# Patient Record
Sex: Female | Born: 2000 | Race: Black or African American | Hispanic: No | Marital: Single | Smoking: Never smoker
Health system: Southern US, Community
[De-identification: ages and names within clinical notes are randomized; demographics above are authoritative.]

## PROBLEM LIST (undated history)

## (undated) DIAGNOSIS — M1990 Unspecified osteoarthritis, unspecified site: Secondary | ICD-10-CM

## (undated) DIAGNOSIS — I10 Essential (primary) hypertension: Secondary | ICD-10-CM

---
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
## 2020-02-03 ENCOUNTER — Emergency Department
Admission: EM | Admit: 2020-02-03 | Discharge: 2020-02-03 | Disposition: A | Discharge status: Home / Self Care | Payer: Medicaid Other | Attending: Emergency Medicine | Admitting: Emergency Medicine

## 2020-02-03 ENCOUNTER — Other Ambulatory Visit: Payer: Self-pay

## 2020-02-03 DIAGNOSIS — J0100 Acute maxillary sinusitis, unspecified: Secondary | ICD-10-CM | POA: Insufficient documentation

## 2020-02-03 DIAGNOSIS — I10 Essential (primary) hypertension: Secondary | ICD-10-CM | POA: Diagnosis not present

## 2020-02-03 DIAGNOSIS — R0981 Nasal congestion: Secondary | ICD-10-CM | POA: Diagnosis present

## 2020-02-03 DIAGNOSIS — Z79899 Other long term (current) drug therapy: Secondary | ICD-10-CM | POA: Diagnosis not present

## 2020-02-03 MED ORDER — FEXOFENADINE-PSEUDOEPHED ER 60-120 MG PO TB12: 1.0000 | ORAL_TABLET | Freq: Two times a day (BID) | ORAL | 0 refills | Status: DC

## 2020-02-03 MED ORDER — AMOXICILLIN-POT CLAVULANATE 875-125 MG PO TABS: 1.0000 | ORAL_TABLET | Freq: Two times a day (BID) | ORAL | 0 refills | Status: DC

## 2020-02-03 NOTE — ED Provider Notes (Signed)
[facility] Emergency Department Provider Note   ____________________________________________   First MD Initiated Contact with Patient 02/03/20 1348     (approximate)  I have reviewed the triage vital signs and the nursing notes.   HISTORY  Chief Complaint Nasal Congestion    HPI Lisa [place] is a 19 y.o. female patient presents with sinus congestion, facial pain, frontal headache for 4 to 5 days.  Patient states there is a greenish nasal discharge.  Patient unsure of fever.  Patient has history of allergic rhinitis and takes maintenance medicine for that complaint.  Patient rates her pain as a 7/10.  Patient described the pain as "achy/pressure".         Past Medical History:  Diagnosis Date  . Arthritis   . Hypertension     There are no problems to display for this patient.   History reviewed. No pertinent surgical history.  Prior to Admission medications   Medication Sig Start Date End Date Taking? Authorizing Provider  albuterol (PROVENTIL HFA;VENTOLIN HFA) 108 (90 Base) MCG/ACT inhaler Inhale 2 puffs into the lungs every 6 (six) hours as needed for wheezing or shortness of breath. 01/10/19   Enid Derry, PA-C  amoxicillin-clavulanate (AUGMENTIN) 875-125 MG tablet Take 1 tablet by mouth 2 (two) times daily. 02/03/20   Joni Reining, PA-C  cloNIDine (CATAPRES) 0.1 MG tablet Take 1 tablet (0.1 mg total) by mouth daily. 01/10/19   Enid Derry, PA-C  fexofenadine-pseudoephedrine (ALLEGRA-D) 60-120 MG 12 hr tablet Take 1 tablet by mouth 2 (two) times daily. 02/03/20   Joni Reining, PA-C  fluticasone (FLONASE) 50 MCG/ACT nasal spray Place 1 spray into both nostrils daily. 01/10/19   Enid Derry, PA-C  hydrOXYzine (ATARAX/VISTARIL) 25 MG tablet Take 1 tablet (25 mg total) by mouth 3 (three) times daily as needed. 01/10/19   Enid Derry, PA-C    Allergies Patient has no known allergies.  No family history on file.  Social History  Social History   Tobacco Use  . Smoking status: Never Smoker  . Smokeless tobacco: Never Used  Substance Use Topics  . Alcohol use: Never  . Drug use: Never    Review of Systems Constitutional: No fever/chills Eyes: No visual changes. ENT: No sore throat.  Nasal congestion. Cardiovascular: Denies chest pain. Respiratory: Denies shortness of breath. Gastrointestinal: No abdominal pain.  No nausea, no vomiting.  No diarrhea.  No constipation. Genitourinary: Negative for dysuria. Musculoskeletal: Negative for back pain. Skin: Negative for rash. Neurological: Negative for headaches, focal weakness or numbness. Endocrine:  Hypertension ____________________________________________   PHYSICAL EXAM:  VITAL SIGNS: ED Triage Vitals  Enc Vitals Group     BP 02/03/20 1337 126/77     Pulse Rate 02/03/20 1337 84     Resp 02/03/20 1337 16     Temp 02/03/20 1337 97.8 F (36.6 C)     Temp Source 02/03/20 1337 Oral     SpO2 02/03/20 1337 99 %     Weight 02/03/20 1335 220 lb (99.8 kg)     Height 02/03/20 1335 5\' 2"  (1.575 m)     Head Circumference --      Peak Flow --      Pain Score 02/03/20 1335 7     Pain Loc --      Pain Edu? --      Excl. in GC? --    Constitutional: Alert and oriented. Well appearing and in no acute distress. Nose: Edematous nasal turbinates.  Bilateral maxillary  guarding. Mouth/Throat: Mucous membranes are moist.  Oropharynx non-erythematous. Cardiovascular: Normal rate, regular rhythm. Grossly normal heart sounds.  Good peripheral circulation. Respiratory: Normal respiratory effort.  No retractions. Lungs CTAB. Skin:  Skin is warm, dry and intact. No rash noted. Psychiatric: Mood and affect are normal. Speech and behavior are normal.  ____________________________________________   LABS (all labs ordered are listed, but only abnormal results are displayed)  Labs Reviewed - No data to display ____________________________________________  EKG    ____________________________________________  RADIOLOGY  ED MD interpretation:    Official radiology report(s): No results found.  ____________________________________________   PROCEDURES  Procedure(s) performed (including Critical Care):  Procedures   ____________________________________________   INITIAL IMPRESSION / ASSESSMENT AND PLAN / ED COURSE  As part of my medical decision making, I reviewed the following data within the [place]     Patient presents with nasal congestion and facial pain.  Differential considerations sinusitis or allergic rhinitis.  Physical exam is consistent with subacute maxillary sinusitis.  Patient given discharge care instructions and advised take medication as directed.  Patient advised establish care with [facility].    Lisa [place] was evaluated in Emergency Department on 02/03/2020 for the symptoms described in the history of present illness. She was evaluated in the context of the global COVID-19 pandemic, which necessitated consideration that the patient might be at risk for infection with the SARS-CoV-2 virus that causes COVID-19. Institutional protocols and algorithms that pertain to the evaluation of patients at risk for COVID-19 are in a state of rapid change based on information released by regulatory bodies including the CDC and federal and state organizations. These policies and algorithms were followed during the patient's care in the ED.       ____________________________________________   FINAL CLINICAL IMPRESSION(S) / ED DIAGNOSES  Final diagnoses:  Subacute maxillary sinusitis     ED Discharge Orders         Ordered    fexofenadine-pseudoephedrine (ALLEGRA-D) 60-120 MG 12 hr tablet  2 times daily     02/03/20 1358    amoxicillin-clavulanate (AUGMENTIN) 875-125 MG tablet  2 times daily     02/03/20 1400           Note:  This document was prepared using Dragon voice recognition  software and may include unintentional dictation errors.    Sable Feil, PA-C 02/03/20 1406    Earleen Newport, MD 02/03/20 1435

## 2020-02-03 NOTE — ED Triage Notes (Signed)
Pt c/o sinus congestion and HA for the past 4 days.

## 2020-02-03 NOTE — Discharge Instructions (Addendum)
Follow discharge care instructions take medication as directed. °

## 2020-02-15 ENCOUNTER — Other Ambulatory Visit: Payer: Self-pay

## 2020-02-15 ENCOUNTER — Emergency Department
Admission: EM | Admit: 2020-02-15 | Discharge: 2020-02-15 | Disposition: A | Discharge status: Home / Self Care | Payer: Medicaid Other | Attending: Student | Admitting: Student

## 2020-02-15 DIAGNOSIS — J301 Allergic rhinitis due to pollen: Secondary | ICD-10-CM

## 2020-02-15 DIAGNOSIS — I10 Essential (primary) hypertension: Secondary | ICD-10-CM | POA: Insufficient documentation

## 2020-02-15 DIAGNOSIS — J019 Acute sinusitis, unspecified: Secondary | ICD-10-CM | POA: Diagnosis present

## 2020-02-15 DIAGNOSIS — Z79899 Other long term (current) drug therapy: Secondary | ICD-10-CM | POA: Diagnosis not present

## 2020-02-15 MED ORDER — CETIRIZINE HCL 10 MG PO TABS: 10.0000 mg | ORAL_TABLET | Freq: Every day | ORAL | 0 refills | Status: AC

## 2020-02-15 MED ORDER — AMOXICILLIN-POT CLAVULANATE 875-125 MG PO TABS: 1.0000 | ORAL_TABLET | Freq: Two times a day (BID) | ORAL | 0 refills | Status: DC

## 2020-02-15 MED ORDER — FLUTICASONE PROPIONATE 50 MCG/ACT NA SUSP: 1.0000 | Freq: Two times a day (BID) | NASAL | 0 refills | Status: DC

## 2020-02-15 NOTE — ED Triage Notes (Signed)
Pt states she was seen in ED 2 weeks ago for same Pt c/o sinus pressure and tight feeling in her nose with pressure behind her eyes

## 2020-02-15 NOTE — ED Provider Notes (Signed)
[facility] Emergency Department Provider Note  ____________________________________________  Time seen: Approximately 6:13 PM  I have reviewed the triage vital signs and the nursing notes.   HISTORY  Chief Complaint Facial Pain    HPI Lisa Sampson is a 19 y.o. female who presents the emergency department for evaluation of sinus pressure.  Patient states that she has pressure in the maxillary and ethmoid sinus region.  No fevers or chills.  She does endorse some nasal congestion but no fevers or chills, sore throat, cough, shortness of breath.  Patient had similar symptoms 2 weeks ago, was treated with antibiotics for same.  She states that this seemed to improve her symptoms somewhat but never fully alleviated her symptoms and they returned in full force.  Patient does have a history of allergic rhinitis but does not take any medications on a regular basis for same.  No medicines for this complaint prior to arrival.         Past Medical History:  Diagnosis Date  . Arthritis   . Hypertension     There are no problems to display for this patient.   History reviewed. No pertinent surgical history.  Prior to Admission medications   Medication Sig Start Date End Date Taking? Authorizing Provider  albuterol (PROVENTIL HFA;VENTOLIN HFA) 108 (90 Base) MCG/ACT inhaler Inhale 2 puffs into the lungs every 6 (six) hours as needed for wheezing or shortness of breath. 01/10/19   Laban Emperor, PA-C  amoxicillin-clavulanate (AUGMENTIN) 875-125 MG tablet Take 1 tablet by mouth 2 (two) times daily. 02/15/20   Suzette Flagler, Charline Bills, PA-C  cetirizine (ZYRTEC) 10 MG tablet Take 1 tablet (10 mg total) by mouth daily. 02/15/20   Novalynn Branaman, Charline Bills, PA-C  cloNIDine (CATAPRES) 0.1 MG tablet Take 1 tablet (0.1 mg total) by mouth daily. 01/10/19   Laban Emperor, PA-C  fluticasone (FLONASE) 50 MCG/ACT nasal spray Place 1 spray into both nostrils 2 (two) times daily. 02/15/20    Jasiyah Paulding, Charline Bills, PA-C  hydrOXYzine (ATARAX/VISTARIL) 25 MG tablet Take 1 tablet (25 mg total) by mouth 3 (three) times daily as needed. 01/10/19   Laban Emperor, PA-C    Allergies Patient has no known allergies.  No family history on file.  Social History Social History   Tobacco Use  . Smoking status: Never Smoker  . Smokeless tobacco: Never Used  Substance Use Topics  . Alcohol use: Never  . Drug use: Never     Review of Systems  Constitutional: No fever/chills Eyes: No visual changes. No discharge ENT: Positive for congestion and sinus pressure Cardiovascular: no chest pain. Respiratory: no cough. No SOB. Gastrointestinal: No abdominal pain.  No nausea, no vomiting.  No diarrhea.  No constipation. Musculoskeletal: Negative for musculoskeletal pain. Skin: Negative for rash, abrasions, lacerations, ecchymosis. Neurological: Negative for headaches, focal weakness or numbness. 10-point ROS otherwise negative.  ____________________________________________   PHYSICAL EXAM:  VITAL SIGNS: ED Triage Vitals  Enc Vitals Group     BP 02/15/20 1611 132/90     Pulse Rate 02/15/20 1611 92     Resp 02/15/20 1611 16     Temp 02/15/20 1611 98.6 F (37 C)     Temp Source 02/15/20 1611 Oral     SpO2 02/15/20 1611 98 %     Weight 02/15/20 1612 220 lb (99.8 kg)     Height 02/15/20 1612 5\' 2"  (1.575 m)     Head Circumference --      Peak Flow --  Pain Score 02/15/20 1612 7     Pain Loc --      Pain Edu? --      Excl. in GC? --      Constitutional: Alert and oriented. Well appearing and in no acute distress. Eyes: Conjunctivae are normal. PERRL. EOMI. Head: Atraumatic. ENT:      Ears:       Nose: Mild congestion/rhinnorhea.  Turbinates are slightly boggy.  No tenderness to percussion over the sinuses.      Mouth/Throat: Mucous membranes are moist.  Neck: No stridor.   Hematological/Lymphatic/Immunilogical: No cervical lymphadenopathy. Cardiovascular: Normal  rate, regular rhythm. Normal S1 and S2.  Good peripheral circulation. Respiratory: Normal respiratory effort without tachypnea or retractions. Lungs CTAB. Good air entry to the bases with no decreased or absent breath sounds. Musculoskeletal: Full range of motion to all extremities. No gross deformities appreciated. Neurologic:  Normal speech and language. No gross focal neurologic deficits are appreciated.  Skin:  Skin is warm, dry and intact. No rash noted. Psychiatric: Mood and affect are normal. Speech and behavior are normal. Patient exhibits appropriate insight and judgement.   ____________________________________________   LABS (all labs ordered are listed, but only abnormal results are displayed)  Labs Reviewed - No data to display ____________________________________________  EKG   ____________________________________________  RADIOLOGY   No results found.  ____________________________________________    PROCEDURES  Procedure(s) performed:    Procedures    Medications - No data to display   ____________________________________________   INITIAL IMPRESSION / ASSESSMENT AND PLAN / ED COURSE  Pertinent labs & imaging results that were available during my care of the patient were reviewed by me and considered in my medical decision making (see chart for details).  Review of the [place] CSRS was performed in accordance of the [facility] prior to dispensing any controlled drugs.           Patient's diagnosis is consistent with allergic rhinitis.  Patient presents emergency department with ongoing nasal congestion, sinus pressure.  Patient had been evaluated 2 weeks ago, diagnosed with bacterial sinusitis.  Patient took her antibiotics, and states that this did not fully alleviate her symptoms.  On exam I find that symptoms are most consistent with allergic rhinitis.  I have a very low suspicion for bacterial sinusitis but as patient was on a course to begin with, I  will repeat the course of antibiotics for the patient.  Patient will also be placed on Flonase, Zyrtec..  Follow-up primary care as needed patient is given ED precautions to return to the ED for any worsening or new symptoms.     ____________________________________________  FINAL CLINICAL IMPRESSION(S) / ED DIAGNOSES  Final diagnoses:  Seasonal allergic rhinitis due to pollen      NEW MEDICATIONS STARTED DURING THIS VISIT:  ED Discharge Orders         Ordered    fluticasone (FLONASE) 50 MCG/ACT nasal spray  2 times daily     02/15/20 1814    cetirizine (ZYRTEC) 10 MG tablet  Daily     02/15/20 1814    amoxicillin-clavulanate (AUGMENTIN) 875-125 MG tablet  2 times daily     02/15/20 1814              This chart was dictated using voice recognition software/Dragon. Despite best efforts to proofread, errors can occur which can change the meaning. Any change was purely unintentional.    Racheal Patches, PA-C 02/15/20 1825    Miguel Aschoff., MD  02/16/20 1500  

## 2020-02-24 ENCOUNTER — Other Ambulatory Visit: Payer: Self-pay | Admitting: Primary Care

## 2020-02-24 DIAGNOSIS — N911 Secondary amenorrhea: Secondary | ICD-10-CM

## 2020-03-02 ENCOUNTER — Ambulatory Visit: Payer: Medicaid Other | Attending: Primary Care

## 2020-03-15 ENCOUNTER — Ambulatory Visit
Admission: RE | Admit: 2020-03-15 | Discharge: 2020-03-15 | Disposition: A | Discharge status: Home / Self Care | Payer: Medicaid Other | Source: Ambulatory Visit | Attending: Primary Care | Admitting: Primary Care

## 2020-03-15 ENCOUNTER — Other Ambulatory Visit: Payer: Self-pay | Admitting: Primary Care

## 2020-03-15 ENCOUNTER — Other Ambulatory Visit: Payer: Self-pay

## 2020-03-15 DIAGNOSIS — N911 Secondary amenorrhea: Secondary | ICD-10-CM

## 2020-03-19 ENCOUNTER — Other Ambulatory Visit: Payer: Self-pay | Admitting: Primary Care

## 2020-03-19 DIAGNOSIS — N644 Mastodynia: Secondary | ICD-10-CM

## 2020-03-25 ENCOUNTER — Other Ambulatory Visit: Payer: Medicaid Other

## 2020-03-30 ENCOUNTER — Telehealth: Payer: Self-pay | Admitting: Obstetrics & Gynecology

## 2020-03-30 NOTE — Telephone Encounter (Signed)
Lisa Sampson referring for secondary amenorrhea with irregular periods u/s with normal uterus, but ovaries not visualized, so PCOS cannot be ruled out. Requesting further evaluation and management. Paper records. Voicemail not set up unable to leave message for patient to call back to be scheduled.

## 2020-04-01 ENCOUNTER — Inpatient Hospital Stay: Admission: RE | Admit: 2020-04-01 | Payer: Medicaid Other | Source: Ambulatory Visit

## 2020-04-12 ENCOUNTER — Encounter: Payer: Medicaid Other | Admitting: Obstetrics and Gynecology

## 2020-04-21 NOTE — Telephone Encounter (Signed)
Voicemail box is full unable to leave message. Multiple attempts to reach patient have been unsuccessful. Noted in the referral notes.

## 2021-04-26 NOTE — Telephone Encounter (Signed)
Voicemail box is full - unable to leave message

## 2021-05-23 IMAGING — US US PELVIS COMPLETE
1 series · 14 of 25 positions shown · non-contrast
Comparison: None

CLINICAL DATA: Secondary amenorrhea, no menstrual period for 4
months, question PCOS

EXAM:
TRANSABDOMINAL ULTRASOUND OF PELVIS
TECHNIQUE: Transabdominal ultrasound examination of the pelvis was performed
including evaluation of the uterus, ovaries, adnexal regions, and
pelvic cul-de-sac. Transvaginal imaging was not performed as patient
denied having been sexually active.

[Series 1: us pelvic complete with transvaginal · 14 of 39 slices shown]
[im 1/39]
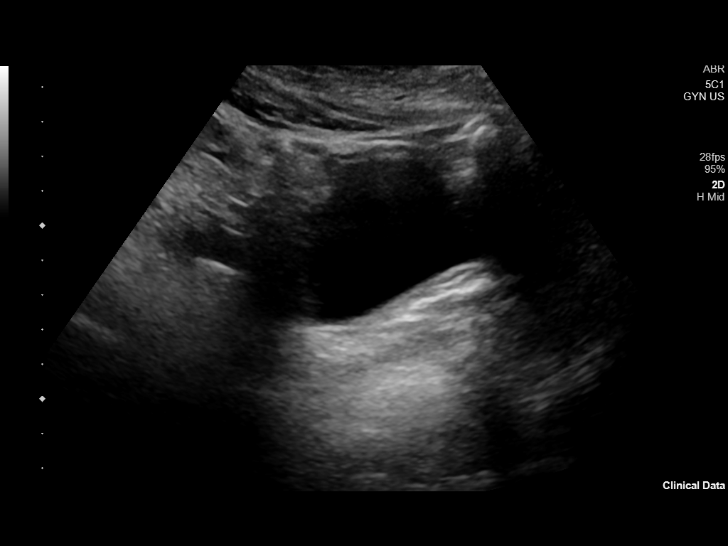
[im 4/39]
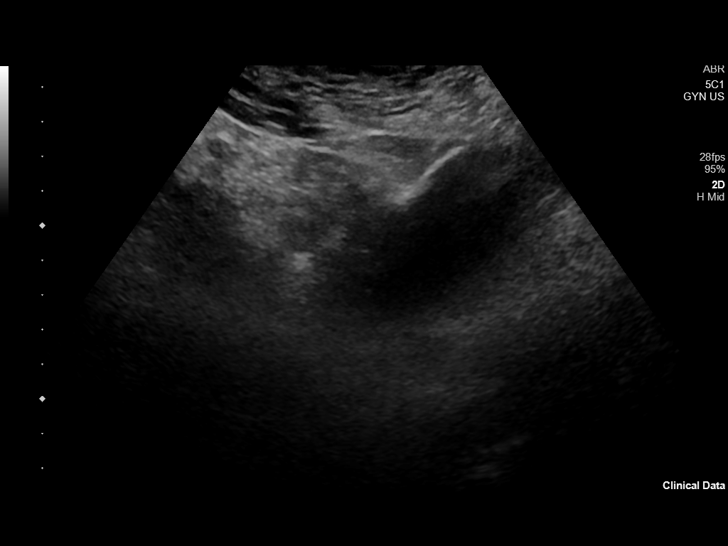
[im 7/39]
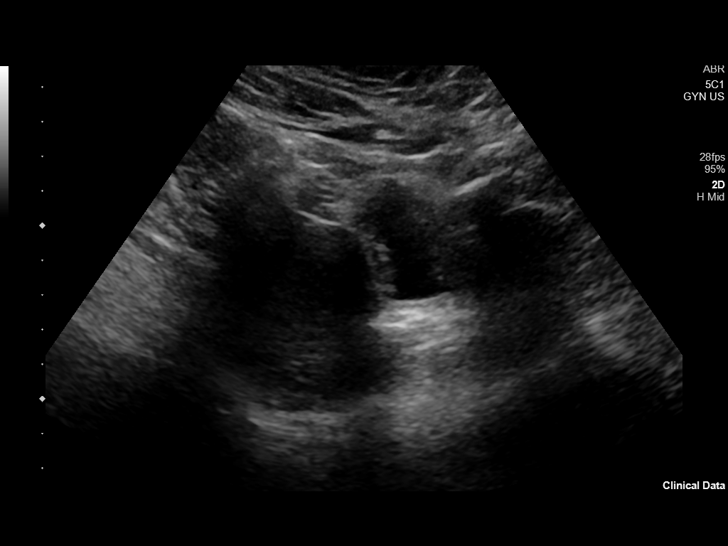
[im 10/39]
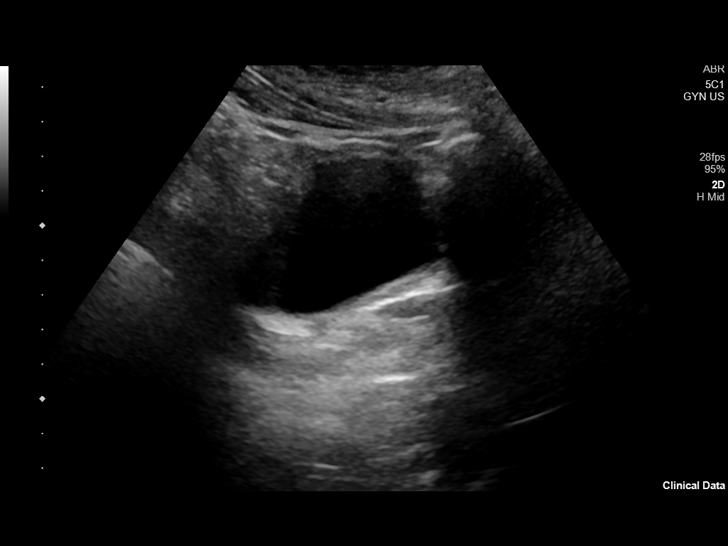
[im 13/39]
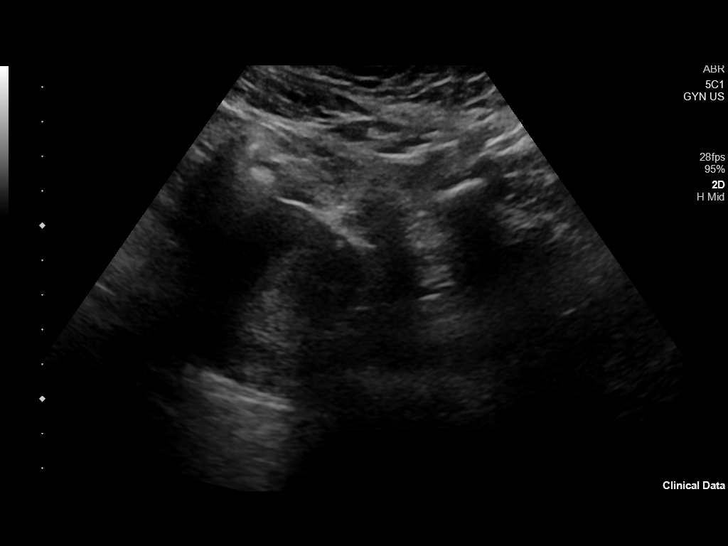
[im 15/39]
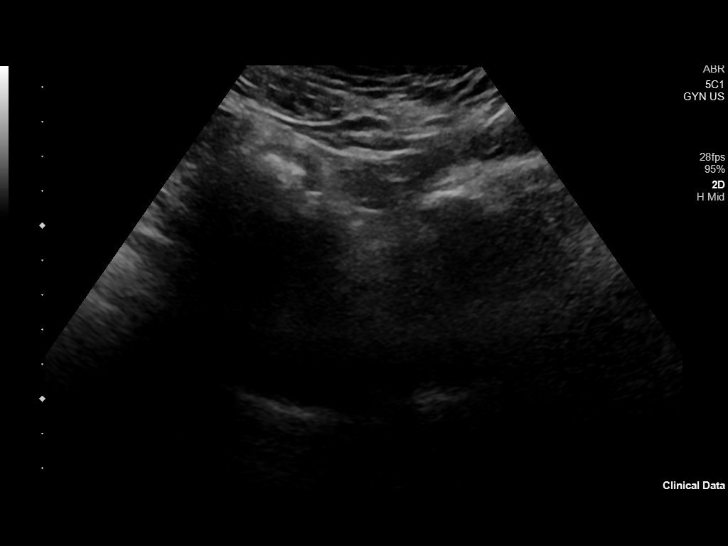
[im 18/39]
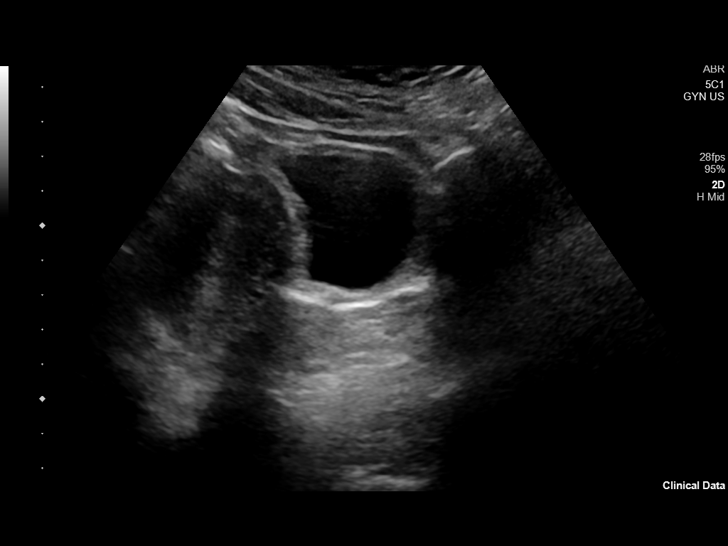
[im 21/39]
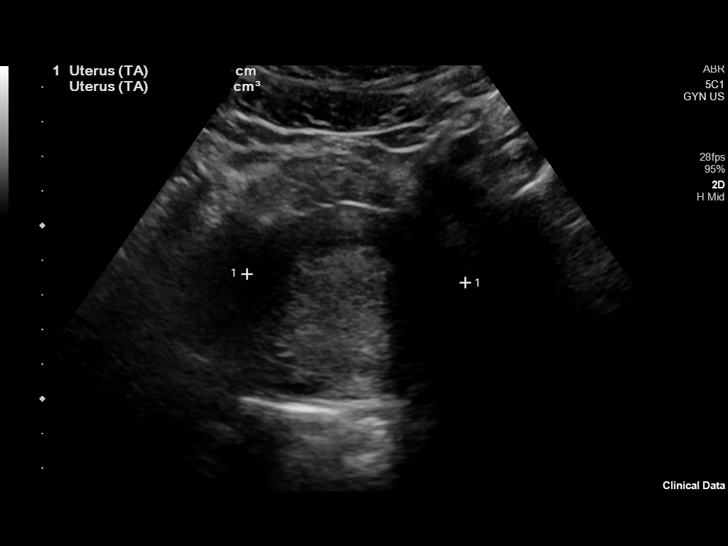
[im 24/39]
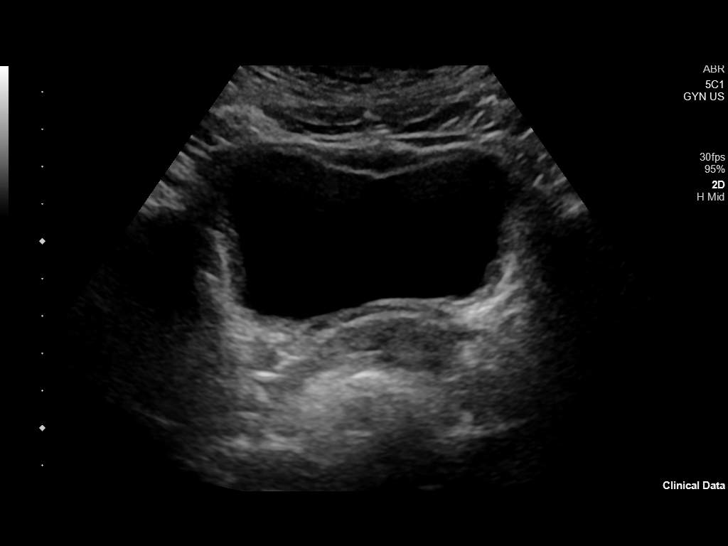
[im 26/39]
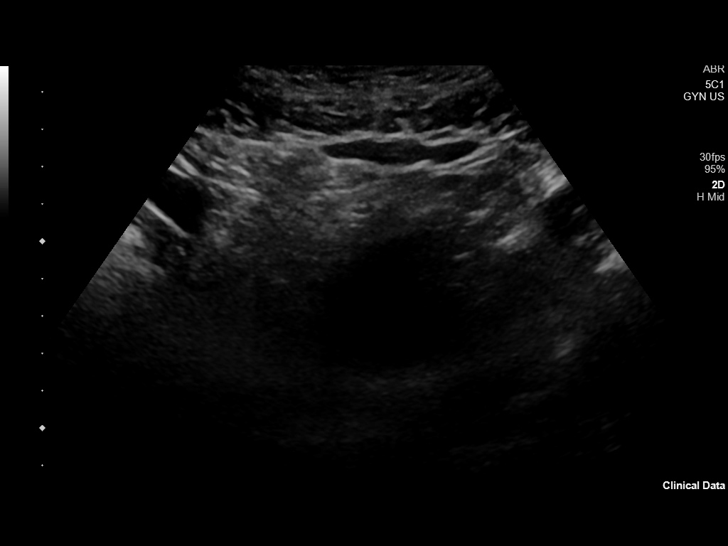
[im 29/39]
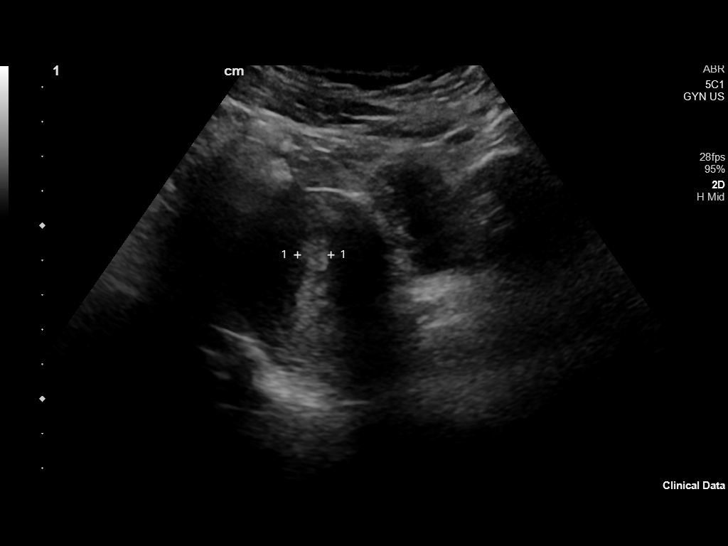
[im 32/39]
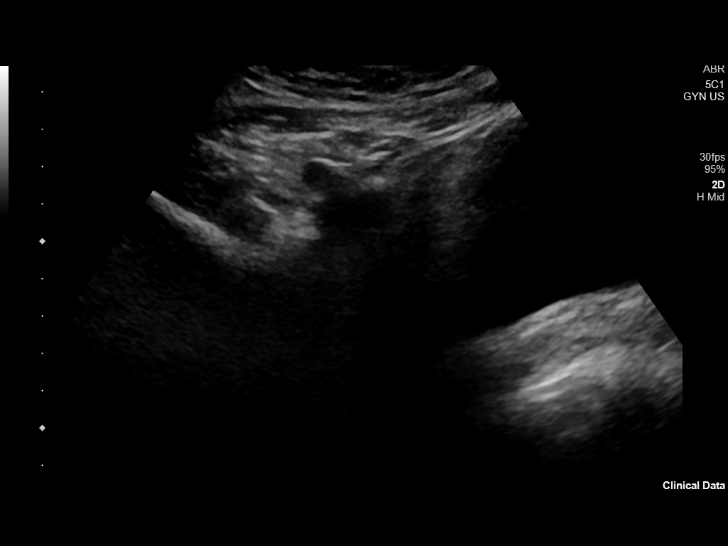
[im 35/39]
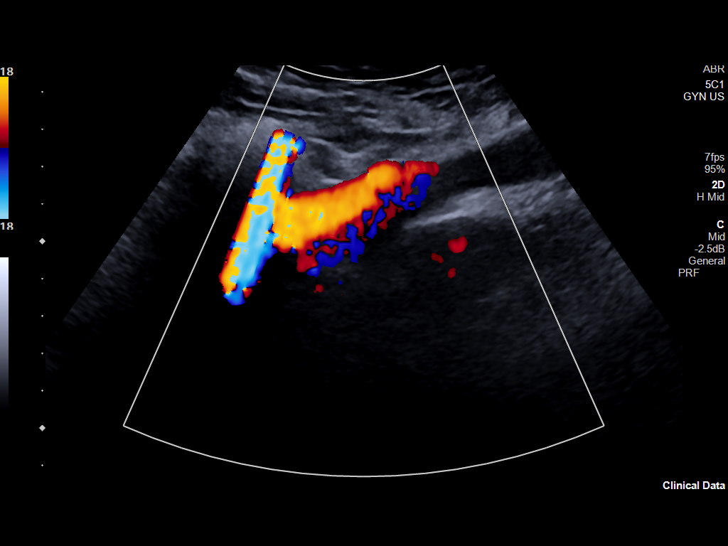
[im 39/39]
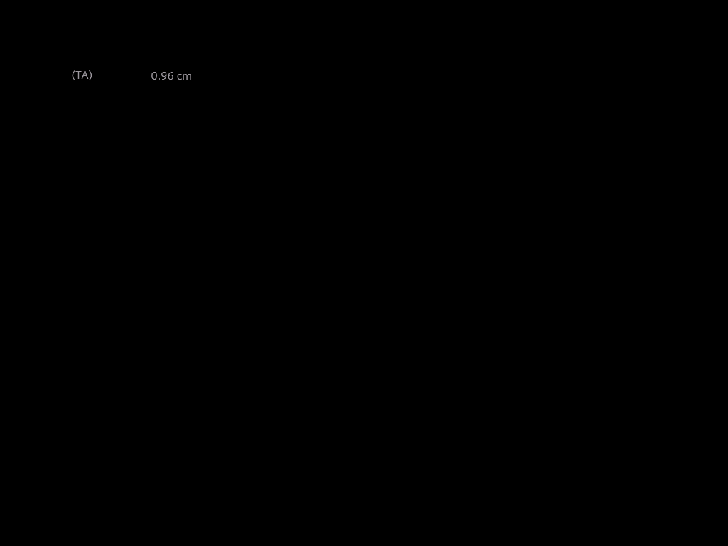

[14 of 25 positions shown; findings below may reference images not displayed]

FINDINGS: Uterus

Measurements: At 6.5 x 4.7 x 6.3 cm = volume: 102 mL. Anteverted.
Suboptimally visualized due to inadequate bladder distention
(despite 2 bottles of water), bowel gas, and body habitus. No focal
uterine mass.

Endometrium

Thickness: 10 mm.  No endometrial fluid or focal abnormality

Right ovary

Not visualized, likely obscured by bowel

Left ovary

Not visualized, likely obscured by bowel

Other findings:  No free pelvic fluid.  No adnexal masses.
IMPRESSION: Unremarkable uterus and endometrial complex.

Nonvisualization of ovaries.

## 2022-08-01 NOTE — Congregational Nurse Program (Signed)
  Dept: [phone]   Congregational Nurse Program Note  Date of Encounter: 08/01/2022  Past Medical History: Past Medical History:  Diagnosis Date   Arthritis    Hypertension     Encounter Details:  CNP Questionnaire - 08/01/22 1633       Questionnaire   Do you give verbal consent to treat you today? Yes    Location Patient Served  [facility]    Visit Setting [facility] or Organization    Patient Status Homeless    Insurance Unknown    Insurance Referral N/A    Medication N/A    Medical Provider Yes    Screening Referrals N/A    Medical Referral [facility]CP[facility]inic    Medical Appointment Made N/A    Food Have Food Insecurities    Transportation N/A    Housing/Utilities N/A    Interpersonal Safety N/A    Intervention Blood pressure;Educate;Counsel;Advocate    ED Visit Averted Yes    Life-Saving Intervention Made Yes            Pt. with her mom and is requesting BP check due to past hx of high BP. BP today is 140/80. No c/o symptoms today but says sometimes she has headaches. Says she has a history of high BP but, her Dr. does not want to start her on medication at her age. Says her PCP is Dr. Phineas Real in [place]. Her mom says she is due now for an annual check-up. Pt. says will make appt. to discuss findings and concerns. High BP handout given and discussed. Says will follow-up with nurse prn.  Joya Gaskins, RN, BSN

## 2022-11-16 NOTE — Congregational Nurse Program (Signed)
  Dept: [phone]   Congregational Nurse Program Note  Date of Encounter: 11/16/2022  Past Medical History: Past Medical History:  Diagnosis Date   Arthritis    Hypertension     Encounter Details:  CNP Questionnaire - 11/16/22 1149       Questionnaire   Ask client: Do you give verbal consent for me to treat you today? Yes    Student Assistance N/A    Location Patient Served  [facility]    Visit Setting with Client [facility]    Patient Status Unhoused    Insurance Medicaid    Insurance/Financial Assistance Referral N/A    Medication N/A    Medical Provider Yes    Screening Referrals Made N/A    Medical Referrals Made [facility]    Medical Appointment Made [facility]    Recently w/o PCP, now 1st time PCP visit completed due to CNs referral or appointment made N/A    Food Have Food Insecurities    Transportation N/A    Housing/Utilities N/A    Interpersonal Safety N/A    Interventions Advocate/Support;Navigate Healthcare System;Case Management;Counsel;Educate    Abnormal to Normal Screening Since Last CN Visit Blood Pressure    Screenings CN Performed Blood Pressure    Sent Client to Lab for: N/A    Did client attend any of the following based off CNs referral or appointments made? N/A    ED Visit Averted Yes    Life-Saving Intervention Made Yes            Spoke with client again. Says was able to get an appt. with [facility] on 12/03/22. BP checked today since has not been checked in a while. BP- 128/78. Denies any problems with headaches, blurred vision, or other visual disturbances. To follow-up with new PCP.  Joya Gaskins, RN, BSN

## 2022-11-16 NOTE — Congregational Nurse Program (Signed)
  Dept: [phone]   Congregational Nurse Program Note  Date of Encounter: 11/16/2022  Past Medical History: Past Medical History:  Diagnosis Date   Arthritis    Hypertension     Encounter Details:  CNP Questionnaire - 11/16/22 1038       Questionnaire   Ask client: Do you give verbal consent for me to treat you today? Yes    Student Assistance N/A    Location Patient Served  [facility]    Visit Setting with Client [facility]    Patient Status Unhoused    Insurance [facility]    Insurance/Financial Assistance Referral N/A    Medical Provider No    Medical Referrals Made [facility]    Medical Appointment Made N/A    Recently w/o PCP, now 1st time PCP visit completed due to CNs referral or appointment made N/A    Food Have Food Insecurities    Transportation N/A    Housing/Utilities N/A    Interpersonal Safety N/A    Interventions Advocate/Support;Navigate Healthcare System;Case Management;Counsel;Educate    Abnormal to Normal Screening Since Last CN Visit N/A    Screenings CN Performed N/A    Sent Client to Lab for: N/A    Did client attend any of the following based off CNs referral or appointments made? N/A    ED Visit Averted Yes    Life-Saving Intervention Made Yes            C/o bruised area at bottom of both ribs. Says she had a job working at [employer] in Oct. and had to lift heavy boxes. Says had soreness at lower ribs with lighter discoloration prior to the same area turning a darker purplish color just recently. Denies being short of breath or having recent coughing episodes. Hx of asthma but denies problems with asthma recently. Denies abdominal pains but says the area is sore when she lifts her arms.Says she will be coming off her mom's Medicaid insurance and will go onto a [employer] plan effective 11/20/22. Last PCP was in [place]. Does not plan to return to [facility] so needs a PCP in area. Gave information on finding a Medicaid PCP as well as an online link  to finding a Acupuncturist provider. Also, gave handout regarding eligibility for the new Medicaid expansion. Encouraged to go to [facility] if develops sharp pains, SOB, or worsening symptoms related to abdominal soreness.    Joya Gaskins, RN, BSN

## 2022-11-30 NOTE — Congregational Nurse Program (Signed)
Member seen at [facility].  Is to begin classes next week.  Has Medicaid under mother.  Reports was working at [employer] and box hit her.  Has bruise on chest wall just below her breast on left side.  Reports did not tell supervisor.  Also no longer working there.  Does not have pcp.  Referred her to [facility] and she wants to go across the street on [address].  Pleasant.  Reports left school in 9th grade.  No children.  Lives with mother.  Will followup. When reviewing record, noted seen by Volney Presser, RN at [facility].  CN noted on last visit had an appt with family Practice 1/14. Pt did not give this info to me.   Vinnie Langton, RN, [place] Nurse, [phone].

## 2022-12-05 NOTE — Congregational Nurse Program (Signed)
  Dept: [phone]   Congregational Nurse Program Note  Date of Encounter: 12/05/2022  Past Medical History: Past Medical History:  Diagnosis Date   Arthritis    Hypertension     Encounter Details:  CNP Questionnaire - 12/05/22 1517       Questionnaire   Ask client: Do you give verbal consent for me to treat you today? Yes    Student Assistance N/A    Location Patient Served  [facility]    Visit Setting with Client Organization    Patient Status Unhoused    Insurance Medicaid    Insurance/Financial Assistance Referral N/A    Medication N/A    Medical Provider No    Screening Referrals Made N/A    Medical Referrals Made N/A    Medical Appointment Made N/A    Recently w/o PCP, now 1st time PCP visit completed due to CNs referral or appointment made N/A    Food Have Food Insecurities;Referred to [employer] N/A    Housing/Utilities No permanent housing    Chiropractor N/A    Interventions Advocate/Support;Navigate Healthcare System;Educate    Abnormal to Normal Screening Since Last CN Visit N/A    Screenings CN Performed N/A    Sent Client to Lab for: N/A    Did client attend any of the following based off CNs referral or appointments made? N/A    ED Visit Averted N/A    Life-Saving Intervention Made N/A             Spoke with client. Says her initial PCP appt. at [facility] is this Thurs., 1/18, at 2 pm. Says has transportation and plans to keep the appointment. Still has some slight bruising on both sides at lower rib area. Client feels it is better than it was. Still working at [employer]. Will follow-up next week.  Volney Presser, RN, BSN

## 2022-12-14 NOTE — Congregational Nurse Program (Signed)
  Dept: [phone]   Congregational Nurse Program Note  Date of Encounter: 12/14/2022  Past Medical History: Past Medical History:  Diagnosis Date   Arthritis    Hypertension     Encounter Details:  CNP Questionnaire - 12/14/22 1102       Questionnaire   Ask client: Do you give verbal consent for me to treat you today? Yes    Student Assistance N/A    Location Patient Served  [facility]    Visit Setting with Client Organization    Insurance Medicaid;Private or [place]    Insurance/Financial Assistance Referral N/A    Medication N/A    Medical Provider Yes    Screening Referrals Made N/A    Medical Referrals Made N/A    Medical Appointment Made N/A    Recently w/o PCP, now 1st time PCP visit completed due to CNs referral or appointment made N/A    Food Have Food Insecurities    Transportation Need transportation assistance    Housing/Utilities No permanent housing    Interpersonal Safety N/A    Interventions Advocate/Support;Case Management;Counsel;Educate    Abnormal to Normal Screening Since Last CN Visit N/A    Screenings CN Performed N/A    Sent Client to Lab for: N/A    Did client attend any of the following based off CNs referral or appointments made? N/A    ED Visit Averted N/A    Life-Saving Intervention Made N/A             Spoke with client. Says she had to reschedule her appt. at [facility] to Feb. 2nd. Says her sister usually takes her but she had an emergency. Discussed Medicaid transportation as an option if needed. Is aware will need to set up at least 2 days in advance. Plan follow-up after 2/2 appt.  Volney Presser, RN, BSN

## 2023-01-02 NOTE — Congregational Nurse Program (Signed)
  Dept: [phone]   Congregational Nurse Program Note  Date of Encounter: 01/02/2023  Past Medical History: Past Medical History:  Diagnosis Date   Arthritis    Hypertension     Encounter Details:  CNP Questionnaire - 01/02/23 1655       Questionnaire   Ask client: Do you give verbal consent for me to treat you today? Yes    Student Assistance N/A    Location Patient Served  [facility]    Visit Setting with Client Organization    Patient Status Unhoused    Insurance Medicaid;Private or [place]    Insurance/Financial Assistance Referral N/A    Medication N/A    Medical Provider Yes    Screening Referrals Made N/A    Medical Referrals Made N/A    Medical Appointment Made N/A    Recently w/o PCP, now 1st time PCP visit completed due to CNs referral or appointment made Yes    Food Have Food Insecurities    Transportation Need transportation assistance    Housing/Utilities No permanent housing    Interpersonal Safety N/A    Interventions Advocate/Support;Case Management;Counsel;Educate    Abnormal to Normal Screening Since Last CN Visit N/A    Screenings CN Performed N/A    Sent Client to Lab for: N/A    Did client attend any of the following based off CNs referral or appointments made? N/A    ED Visit Averted N/A    Life-Saving Intervention Made N/A            Says went to appt. at [facility] on 2/2. Bruising at lower ribs has improved. BP was slightly elevated. Was instructed by her Dr. to limit salt intake and monitor BP with her at home BP cuff. Is no longer working at [employer] now so not lifting heavy objects. Has inquired about another job where she will not be lifting. Follow-up prn.  Sylvester Harder, BSN

## 2023-03-06 ENCOUNTER — Telehealth: Payer: Self-pay | Admitting: Family Medicine

## 2023-03-06 ENCOUNTER — Ambulatory Visit (HOSPITAL_COMMUNITY): Payer: Self-pay

## 2023-03-06 DIAGNOSIS — J4520 Mild intermittent asthma, uncomplicated: Secondary | ICD-10-CM

## 2023-03-06 MED ORDER — ALBUTEROL SULFATE HFA 108 (90 BASE) MCG/ACT IN AERS: 2.0000 | INHALATION_SPRAY | Freq: Four times a day (QID) | RESPIRATORY_TRACT | 0 refills | Status: AC | PRN

## 2023-03-06 MED ORDER — MONTELUKAST SODIUM 10 MG PO TABS: 10.0000 mg | ORAL_TABLET | Freq: Every day | ORAL | 1 refills | Status: AC

## 2023-03-06 MED ORDER — FLUTICASONE PROPIONATE 50 MCG/ACT NA SUSP: 2.0000 | Freq: Every day | NASAL | 0 refills | Status: AC

## 2023-03-06 NOTE — Patient Instructions (Signed)
Eino Farber, thank you for joining Freddy Finner, NP for today's virtual visit.  While this provider is not your primary care provider (PCP), if your PCP is located in our provider database this encounter information will be shared with them immediately following your visit.   A [facility] MyChart account gives you access to today's visit and all your visits, tests, and labs performed at [facility] " click here if you don't have a [facility] MyChart account or go to [email or web address]  Consent: (Patient) Lisa Sampson provided verbal consent for this virtual visit at the beginning of the encounter.  Current Medications:  Current Outpatient Medications:    albuterol (VENTOLIN HFA) 108 (90 Base) MCG/ACT inhaler, Inhale 2 puffs into the lungs every 6 (six) hours as needed for wheezing or shortness of breath., Disp: 8 g, Rfl: 0   fluticasone (FLONASE) 50 MCG/ACT nasal spray, Place 2 sprays into both nostrils daily., Disp: 16 g, Rfl: 0   montelukast (SINGULAIR) 10 MG tablet, Take 1 tablet (10 mg total) by mouth at bedtime., Disp: 30 tablet, Rfl: 1   amoxicillin-clavulanate (AUGMENTIN) 875-125 MG tablet, Take 1 tablet by mouth 2 (two) times daily., Disp: 14 tablet, Rfl: 0   cetirizine (ZYRTEC) 10 MG tablet, Take 1 tablet (10 mg total) by mouth daily., Disp: 30 tablet, Rfl: 0   cloNIDine (CATAPRES) 0.1 MG tablet, Take 1 tablet (0.1 mg total) by mouth daily., Disp: 30 tablet, Rfl: 0   hydrOXYzine (ATARAX/VISTARIL) 25 MG tablet, Take 1 tablet (25 mg total) by mouth 3 (three) times daily as needed., Disp: 30 tablet, Rfl: 0   Medications ordered in this encounter:  Meds ordered this encounter  Medications   montelukast (SINGULAIR) 10 MG tablet    Sig: Take 1 tablet (10 mg total) by mouth at bedtime.    Dispense:  30 tablet    Refill:  1    Order Specific Question:   Supervising Provider    Answer:   Loreli Dollar   albuterol (VENTOLIN HFA) 108 (90 Base)  MCG/ACT inhaler    Sig: Inhale 2 puffs into the lungs every 6 (six) hours as needed for wheezing or shortness of breath.    Dispense:  8 g    Refill:  0    Order Specific Question:   Supervising Provider    Answer:   Merrilee Jansky [[id number]]   fluticasone (FLONASE) 50 MCG/ACT nasal spray    Sig: Place 2 sprays into both nostrils daily.    Dispense:  16 g    Refill:  0    Order Specific Question:   Supervising Provider    Answer:   Merrilee Jansky [id number]     *If you need refills on other medications prior to your next appointment, please contact your pharmacy*  Follow-Up: Call back or seek an in-person evaluation if the symptoms worsen or if the condition fails to improve as anticipated.  [facility] [phone]  Other Instructions Asthma, Adult  Asthma is a condition that causes swelling and narrowing of the airways. These are the passages that lead from the nose and mouth down into the lungs. When asthma symptoms get worse it is called an asthma attack or flare. This can make it hard to breathe. Asthma flares can range from minor to life-threatening. There is no cure for asthma, but medicines and lifestyle changes can help to control it. What are the causes? It is not known exactly what causes asthma,  but certain things can cause asthma symptoms to get worse (triggers). What can trigger an asthma attack? Cigarette smoke. Mold. Dust. Your pet's skin flakes (dander). Cockroaches. Pollen. Air pollution (like household cleaners, wood smoke, smog, or Therapist, occupational). What are the signs or symptoms? Trouble breathing (shortness of breath). Coughing. Making high-pitched whistling sounds when you breathe, most often when you breathe out (wheezing). Chest tightness. Tiredness with little activity. Poor exercise tolerance. How is this treated? Controller medicines that help prevent asthma symptoms. Fast-acting reliever or rescue medicines. These give  short-term relief of asthma symptoms. Allergy medicines if your attacks are brought on by allergens. Medicines to help control the body's defense (immune) system. Staying away from the things that cause asthma attacks. Follow these instructions at home: Avoiding triggers in your home Do not allow anyone to smoke in your home. Limit use of fireplaces and wood stoves. Get rid of pests (such as roaches and mice) and their droppings. Keep your home clean. Clean your floors. Dust regularly. Use cleaning products that do not smell. Wash bed sheets and blankets every week in hot water. Dry them in a dryer. Have someone vacuum when you are not home. Change your heating and air conditioning filters often. Use blankets that are made of polyester or cotton. General instructions Take over-the-counter and prescription medicines only as told by your doctor. Do not smoke or use any products that contain nicotine or tobacco. If you need help quitting, ask your doctor. Stay away from secondhand smoke. Avoid doing things outdoors when allergen counts are high and when air quality is low. Warm up before you exercise. Take time to cool down after exercise. Use a peak flow meter as told by your doctor. A peak flow meter is a tool that measures how well your lungs are working. Keep track of the peak flow meter's readings. Write them down. Follow your asthma action plan. This is a written plan for taking care of your asthma and treating your attacks. Make sure you get all the shots (vaccines) that your doctor recommends. Ask your doctor about a flu shot and a pneumonia shot. Keep all follow-up visits. Contact a doctor if: You have wheezing, shortness of breath, or a cough even while taking medicine to prevent attacks. The mucus you cough up (sputum) is thicker than usual. The mucus you cough up changes from clear or white to yellow, green, gray, or is bloody. You have problems from the medicine you are  taking, such as: A rash. Itching. Swelling. Trouble breathing. You need reliever medicines more than 2-3 times a week. Your peak flow reading is still at 50-79% of your personal best after following the action plan for 1 hour. You have a fever. Get help right away if: You seem to be worse and are not responding to medicine during an asthma attack. You are short of breath even at rest. You get short of breath when doing very little activity. You have trouble eating, drinking, or talking. You have chest pain or tightness. You have a fast heartbeat. Your lips or fingernails start to turn blue. You are light-headed or dizzy, or you faint. Your peak flow is less than 50% of your personal best. You feel too tired to breathe normally. These symptoms may be an emergency. Get help right away. Call 911. Do not wait to see if the symptoms will go away. Do not drive yourself to the hospital. Summary Asthma is a long-term (chronic) condition in which the airways get tight  and narrow. An asthma attack can make it hard to breathe. Asthma cannot be cured, but medicines and lifestyle changes can help control it. Make sure you understand how to avoid triggers and how and when to use your medicines. Avoid things that can cause allergy symptoms (allergens). These include animal skin flakes (dander) and pollen from trees or grass. Avoid things that pollute the air. These may include household cleaners, wood smoke, smog, or chemical odors. This information is not intended to replace advice given to you by your health care provider. Make sure you discuss any questions you have with your health care provider. Document Revised: 08/15/2021 Document Reviewed: 08/15/2021 Elsevier Patient Education  2023 Elsevier Inc.   If you have been instructed to have an in-person evaluation today at a local Urgent Care facility, please use the link below. It will take you to a list of all of our available [facility], including address, phone number and hours of operation. Please do not delay care.  [facility]  If you or a family member do not have a primary care provider, use the link below to schedule a visit and establish care. When you choose a [facility] primary care physician or advanced practice provider, you gain a long-term partner in health. Find a Primary Care Provider  Learn more about [facility] in-office and virtual care options:  - Get Care Now

## 2023-03-06 NOTE — Progress Notes (Signed)
Virtual Visit Consent   Lisa Sampson, you are scheduled for a virtual visit with a [facility] provider today. Just as with appointments in the office, your consent must be obtained to participate. Your consent will be active for this visit and any virtual visit you may have with one of our providers in the next 365 days. If you have a MyChart account, a copy of this consent can be sent to you electronically.  As this is a virtual visit, video technology does not allow for your provider to perform a traditional examination. This may limit your provider's ability to fully assess your condition. If your provider identifies any concerns that need to be evaluated in person or the need to arrange testing (such as labs, EKG, etc.), we will make arrangements to do so. Although advances in technology are sophisticated, we cannot ensure that it will always work on either your end or our end. If the connection with a video visit is poor, the visit may have to be switched to a telephone visit. With either a video or telephone visit, we are not always able to ensure that we have a secure connection.  By engaging in this virtual visit, you consent to the provision of healthcare and authorize for your insurance to be billed (if applicable) for the services provided during this visit. Depending on your insurance coverage, you may receive a charge related to this service.  I need to obtain your verbal consent now. Are you willing to proceed with your visit today? Lisa Sampson has provided verbal consent on 03/06/2023 for a virtual visit (video or telephone). Freddy Finner, NP  Date: 03/06/2023 9:33 AM  Virtual Visit via Video Note   I, Freddy Finner, connected with  Lisa Sampson  ([id number], [birth year]) on 03/06/23 at  9:30 AM EDT by a video-enabled telemedicine application and verified that I am speaking with the correct person using two identifiers.  Location: Patient: Virtual Visit Location Patient:  Home Provider: Virtual Visit Location Provider: Home Office   I discussed the limitations of evaluation and management by telemedicine and the availability of in person appointments. The patient expressed understanding and agreed to proceed.    History of Present Illness: Lisa Sampson is a 22 y.o. who identifies as a female who was assigned female at birth, and is being seen today for asthma trouble  Onset was 4-5 days ago- started having shortness of breath and congestion Associated symptoms are sinus pressure under eyes, shortness of breath is mostly related with active. Has had to use an inhaler in the last year- this was given to by PCP in [place].  Modifying factors are in ran out of her inhaler a few months ago, not on allergy meds right now Denies chest pain, fevers, chills   Problems: There are no problems to display for this patient.   Allergies: No Known Allergies Medications:  Current Outpatient Medications:    albuterol (PROVENTIL HFA;VENTOLIN HFA) 108 (90 Base) MCG/ACT inhaler, Inhale 2 puffs into the lungs every 6 (six) hours as needed for wheezing or shortness of breath., Disp: 1 Inhaler, Rfl: 0   amoxicillin-clavulanate (AUGMENTIN) 875-125 MG tablet, Take 1 tablet by mouth 2 (two) times daily., Disp: 14 tablet, Rfl: 0   cetirizine (ZYRTEC) 10 MG tablet, Take 1 tablet (10 mg total) by mouth daily., Disp: 30 tablet, Rfl: 0   cloNIDine (CATAPRES) 0.1 MG tablet, Take 1 tablet (0.1 mg total) by mouth daily., Disp: 30 tablet, Rfl: 0  fluticasone (FLONASE) 50 MCG/ACT nasal spray, Place 1 spray into both nostrils 2 (two) times daily., Disp: 16 g, Rfl: 0   hydrOXYzine (ATARAX/VISTARIL) 25 MG tablet, Take 1 tablet (25 mg total) by mouth 3 (three) times daily as needed., Disp: 30 tablet, Rfl: 0  Observations/Objective: Patient is well-developed, well-nourished in no acute distress.  Resting comfortably  at home.  Head is normocephalic, atraumatic.  No labored breathing.   Speech is clear and coherent with logical content.  Patient is alert and oriented at baseline.    Assessment and Plan: 1. Mild intermittent asthma without complication  - montelukast (SINGULAIR) 10 MG tablet; Take 1 tablet (10 mg total) by mouth at bedtime.  Dispense: 30 tablet; Refill: 1 - albuterol (VENTOLIN HFA) 108 (90 Base) MCG/ACT inhaler; Inhale 2 puffs into the lungs every 6 (six) hours as needed for wheezing or shortness of breath.  Dispense: 8 g; Refill: 0 - fluticasone (FLONASE) 50 MCG/ACT nasal spray; Place 2 sprays into both nostrils daily.  Dispense: 16 g; Refill: 0  -follow up with PCP since starting singular would benefit allergy and asthma overlap -if not improved with above be seen in person    Reviewed side effects, risks and benefits of medication.    Patient acknowledged agreement and understanding of the plan.   Past Medical, Surgical, Social History, Allergies, and Medications have been Reviewed.     Follow Up Instructions: I discussed the assessment and treatment plan with the patient. The patient was provided an opportunity to ask questions and all were answered. The patient agreed with the plan and demonstrated an understanding of the instructions.  A copy of instructions were sent to the patient via MyChart unless otherwise noted below.    The patient was advised to call back or seek an in-person evaluation if the symptoms worsen or if the condition fails to improve as anticipated.  Time:  I spent 10 minutes with the patient via telehealth technology discussing the above problems/concerns.    Freddy Finner, NP

## 2023-09-27 ENCOUNTER — Ambulatory Visit (INDEPENDENT_AMBULATORY_CARE_PROVIDER_SITE_OTHER): Payer: 59

## 2023-09-27 ENCOUNTER — Inpatient Hospital Stay (HOSPITAL_COMMUNITY)
Admission: RE | Admit: 2023-09-27 | Discharge: 2023-09-27 | Disposition: A | Discharge status: Home / Self Care | Payer: Self-pay | Source: Ambulatory Visit

## 2023-09-27 ENCOUNTER — Encounter (HOSPITAL_COMMUNITY): Payer: Self-pay | Admitting: *Deleted

## 2023-09-27 ENCOUNTER — Ambulatory Visit (HOSPITAL_COMMUNITY)
Admission: EM | Admit: 2023-09-27 | Discharge: 2023-09-27 | Disposition: A | Discharge status: Home / Self Care | Payer: 59 | Attending: Emergency Medicine | Admitting: Emergency Medicine

## 2023-09-27 DIAGNOSIS — M79644 Pain in right finger(s): Secondary | ICD-10-CM

## 2023-09-27 NOTE — Discharge Instructions (Addendum)
Rest if you can  Ice (10-15 minutes) and elevate the hand to reduce swelling Ibuprofen for pain Please monitor symptoms and follow up with orthopedics if still having symptoms

## 2023-09-27 NOTE — ED Provider Notes (Signed)
[facility]    CSN: [id number] Arrival date & time: 09/27/23  1206      History   Chief Complaint Chief Complaint  Patient presents with   Hand Pain    HPI Lisa Sampson is a 22 y.o. female.  2 days ago woke with pain and stiffness in the right hand index finger. 7/10 pain. She is not aware of any injury or trauma. She performs repetitive motions at work, is right hand dominant.  Has used ibuprofen with relief  No prior injury   Past Medical History:  Diagnosis Date   Arthritis    Hypertension     There are no problems to display for this patient.   History reviewed. No pertinent surgical history.  OB History   No obstetric history on file.      Home Medications    Prior to Admission medications   Medication Sig Start Date End Date Taking? Authorizing Provider  albuterol (VENTOLIN HFA) 108 (90 Base) MCG/ACT inhaler Inhale 2 puffs into the lungs every 6 (six) hours as needed for wheezing or shortness of breath. 03/06/23   Freddy Finner, NP  cetirizine (ZYRTEC) 10 MG tablet Take 1 tablet (10 mg total) by mouth daily. 02/15/20   Cuthriell, Delorise Royals, PA-C  fluticasone (FLONASE) 50 MCG/ACT nasal spray Place 2 sprays into both nostrils daily. 03/06/23   Freddy Finner, NP  hydrOXYzine (ATARAX/VISTARIL) 25 MG tablet Take 1 tablet (25 mg total) by mouth 3 (three) times daily as needed. 01/10/19   Enid Derry, PA-C  montelukast (SINGULAIR) 10 MG tablet Take 1 tablet (10 mg total) by mouth at bedtime. 03/06/23   Freddy Finner, NP    Family History History reviewed. No pertinent family history.  Social History Social History   Tobacco Use   Smoking status: Never   Smokeless tobacco: Never  Vaping Use   Vaping status: Never Used  Substance Use Topics   Alcohol use: Never   Drug use: Never     Allergies   Patient has no known allergies.   Review of Systems Review of Systems Per HPI  Physical Exam Triage Vital Signs ED Triage Vitals   Encounter Vitals Group     BP 09/27/23 1218 130/84     Systolic BP Percentile --      Diastolic BP Percentile --      Pulse Rate 09/27/23 1218 (!) 112     Resp 09/27/23 1218 18     Temp 09/27/23 1218 98.6 F (37 C)     Temp Source 09/27/23 1218 Oral     SpO2 09/27/23 1218 96 %     Weight --      Height --      Head Circumference --      Peak Flow --      Pain Score 09/27/23 1216 7     Pain Loc --      Pain Education --      Exclude from Growth Chart --    No data found.  Updated Vital Signs BP 130/84 (BP Location: Left Arm)   Pulse 80   Temp 98.6 F (37 C) (Oral)   Resp 18   LMP 09/12/2023 (Approximate)   SpO2 96%   Physical Exam Vitals and nursing note reviewed.  Constitutional:      General: She is not in acute distress. Cardiovascular:     Rate and Rhythm: Normal rate and regular rhythm.     Pulses: Normal pulses.  Heart sounds: Normal heart sounds.  Pulmonary:     Effort: Pulmonary effort is normal.     Breath sounds: Normal breath sounds.  Musculoskeletal:     Comments: Right hand index finger full ROM at MCP without pain. Passive ROM at PIP and DIP causing pain. Reports located lateral finger. No obvious deformity. Cap refill < 2 seconds, sensation normal. Grip strength 5/5  Skin:    General: Skin is warm and dry.     Capillary Refill: Capillary refill takes less than 2 seconds.     Findings: No bruising.  Neurological:     Mental Status: She is alert and oriented to person, place, and time.     UC Treatments / Results  Labs (all labs ordered are listed, but only abnormal results are displayed) Labs Reviewed - No data to display  EKG  Radiology DG Finger Index Right  Result Date: 09/27/2023 CLINICAL DATA:  Pain and stiffness right index finger for 2 days EXAM: RIGHT INDEX FINGER 2+V COMPARISON:  None Available. FINDINGS: Frontal, oblique, and lateral views of the right second digit are obtained. No acute fracture, subluxation, or dislocation.  Soft tissues are unremarkable. IMPRESSION: 1. Unremarkable right second digit. Electronically Signed   By: Sharlet Salina M.D.   On: 09/27/2023 14:41    Procedures Procedures   Medications Ordered in UC Medications - No data to display  Initial Impression / Assessment and Plan / UC Course  I have reviewed the triage vital signs and the nursing notes.  Pertinent labs & imaging results that were available during my care of the patient were reviewed by me and considered in my medical decision making (see chart for details).  Right index finger xray is normal Discussed possible etiologies. Could be overuse injury from repetitive motions. Recommend using ibu if helpful, gentle movements and exercises of the finger, contacting PCP if persisting. Ice/elevate. Ortho follow up if needed  Final Clinical Impressions(s) / UC Diagnoses   Final diagnoses:  Pain in finger of right hand     Discharge Instructions      Rest if you can  Ice (10-15 minutes) and elevate the hand to reduce swelling Ibuprofen for pain Please monitor symptoms and follow up with orthopedics if still having symptoms      ED Prescriptions   None    PDMP not reviewed this encounter.   Aubryana Vittorio, Lurena Joiner, [place] 09/28/23 8413

## 2023-09-27 NOTE — ED Triage Notes (Signed)
Pt states she woke up 2 days ago and her right index finger was hurting and she couldn't move it. She denies any injury. She has been taking 800mg  IBU

## 2023-10-12 ENCOUNTER — Ambulatory Visit (HOSPITAL_COMMUNITY): Payer: Self-pay

## 2023-10-13 ENCOUNTER — Ambulatory Visit (HOSPITAL_COMMUNITY): Payer: 59

## 2023-10-14 ENCOUNTER — Ambulatory Visit (HOSPITAL_COMMUNITY): Payer: Self-pay

## 2023-10-15 DIAGNOSIS — R300 Dysuria: Secondary | ICD-10-CM | POA: Diagnosis not present
